# Patient Record
Sex: Male | Born: 1995 | Race: White | Hispanic: No | Marital: Single | Smoking: Never smoker
Health system: Southern US, Community
[De-identification: ages and names within clinical notes are randomized; demographics above are authoritative.]

## PROBLEM LIST (undated history)

## (undated) HISTORY — PX: HERNIA REPAIR: SHX51

---

## 2015-02-05 ENCOUNTER — Encounter (HOSPITAL_COMMUNITY): Payer: Self-pay | Admitting: Cardiology

## 2015-02-05 ENCOUNTER — Emergency Department (HOSPITAL_COMMUNITY): Payer: PRIVATE HEALTH INSURANCE

## 2015-02-05 ENCOUNTER — Emergency Department (HOSPITAL_COMMUNITY)
Admission: EM | Admit: 2015-02-05 | Discharge: 2015-02-05 | Disposition: A | Discharge status: Home / Self Care | Payer: PRIVATE HEALTH INSURANCE | Attending: Emergency Medicine | Admitting: Emergency Medicine

## 2015-02-05 DIAGNOSIS — R1033 Periumbilical pain: Secondary | ICD-10-CM | POA: Diagnosis not present

## 2015-02-05 DIAGNOSIS — R1031 Right lower quadrant pain: Secondary | ICD-10-CM | POA: Diagnosis not present

## 2015-02-05 LAB — BASIC METABOLIC PANEL
Anion gap: 6 (ref 5–15)
BUN: 10 mg/dL (ref 6–20)
CALCIUM: 8.9 mg/dL (ref 8.9–10.3)
CO2: 28 mmol/L (ref 22–32)
CREATININE: 0.75 mg/dL (ref 0.61–1.24)
Chloride: 100 mmol/L — ABNORMAL LOW (ref 101–111)
Glucose, Bld: 101 mg/dL — ABNORMAL HIGH (ref 65–99)
Potassium: 3.7 mmol/L (ref 3.5–5.1)
SODIUM: 134 mmol/L — AB (ref 135–145)

## 2015-02-05 LAB — CBC WITH DIFFERENTIAL/PLATELET
BASOS PCT: 0 % (ref 0–1)
Basophils Absolute: 0 10*3/uL (ref 0.0–0.1)
EOS ABS: 0 10*3/uL (ref 0.0–0.7)
EOS PCT: 0 % (ref 0–5)
HCT: 49.5 % (ref 39.0–52.0)
Hemoglobin: 17.5 g/dL — ABNORMAL HIGH (ref 13.0–17.0)
LYMPHS ABS: 1 10*3/uL (ref 0.7–4.0)
Lymphocytes Relative: 8 % — ABNORMAL LOW (ref 12–46)
MCH: 31.6 pg (ref 26.0–34.0)
MCHC: 35.4 g/dL (ref 30.0–36.0)
MCV: 89.4 fL (ref 78.0–100.0)
MONOS PCT: 9 % (ref 3–12)
Monocytes Absolute: 1.2 10*3/uL — ABNORMAL HIGH (ref 0.1–1.0)
Neutro Abs: 10.8 10*3/uL — ABNORMAL HIGH (ref 1.7–7.7)
Neutrophils Relative %: 83 % — ABNORMAL HIGH (ref 43–77)
PLATELETS: 245 10*3/uL (ref 150–400)
RBC: 5.54 MIL/uL (ref 4.22–5.81)
RDW: 12.6 % (ref 11.5–15.5)
WBC: 13 10*3/uL — AB (ref 4.0–10.5)

## 2015-02-05 MED ORDER — IOHEXOL 300 MG/ML  SOLN
25.0000 mL | Freq: Once | INTRAMUSCULAR | Status: AC | PRN
  Administered 2015-02-05: 25 mL via ORAL

## 2015-02-05 MED ORDER — IOHEXOL 300 MG/ML  SOLN
100.0000 mL | Freq: Once | INTRAMUSCULAR | Status: AC | PRN
  Administered 2015-02-05: 100 mL via INTRAVENOUS

## 2015-02-05 MED ORDER — SODIUM CHLORIDE 0.9 % IV BOLUS (SEPSIS)
1000.0000 mL | Freq: Once | INTRAVENOUS | Status: AC
  Administered 2015-02-05: 1000 mL via INTRAVENOUS

## 2015-02-05 MED ORDER — TRAMADOL HCL 50 MG PO TABS: 50.0000 mg | ORAL_TABLET | Freq: Four times a day (QID) | ORAL | Status: AC | PRN

## 2015-02-05 MED ORDER — DICYCLOMINE HCL 20 MG PO TABS: 20.0000 mg | ORAL_TABLET | Freq: Two times a day (BID) | ORAL | Status: AC

## 2015-02-05 NOTE — ED Provider Notes (Signed)
CSN: [id number]     Arrival date & time 02/05/15  1255 History   First MD Initiated Contact with Patient 02/05/15 1301     Chief Complaint  Patient presents with  . Abdominal Pain     (Consider location/radiation/quality/duration/timing/severity/associated sxs/prior Treatment) HPI Comments: Patient presents to the ER for evaluation of pain in his abdomen. Patient reports the symptoms began last night around 10 PM. He reports that the pain has been continuous since it began, has worsened. He was up most of the night because of pain. Movement makes it worse. He has not found any alleviating factors. He denies fever, nausea, vomiting, diarrhea and constipation. He has not had any urinary symptoms.  Patient is a 19 y.o. male presenting with abdominal pain.  Abdominal Pain   History reviewed. No pertinent past medical history. Past Surgical History  Procedure Laterality Date  . Hernia repair     History reviewed. No pertinent family history. Social History  Substance Use Topics  . Smoking status: Never Smoker   . Smokeless tobacco: None  . Alcohol Use: No    Review of Systems  Gastrointestinal: Positive for abdominal pain.  All other systems reviewed and are negative.     Allergies  Review of patient's allergies indicates no known allergies.  Home Medications   Prior to Admission medications   Not on File   BP 131/72 mmHg  Pulse 98  Temp(Src) 98.4 F (36.9 C) (Oral)  Resp 16  Ht 5\' 6"  (1.676 m)  Wt 189 lb (85.73 kg)  BMI 30.52 kg/m2  SpO2 100% Physical Exam  Constitutional: He is oriented to person, place, and time. He appears well-developed and well-nourished. No distress.  HENT:  Head: Normocephalic and atraumatic.  Right Ear: Hearing normal.  Left Ear: Hearing normal.  Nose: Nose normal.  Mouth/Throat: Oropharynx is clear and moist and mucous membranes are normal.  Eyes: Conjunctivae and EOM are normal. Pupils are equal, round, and reactive to light.  Neck:  Normal range of motion. Neck supple.  Cardiovascular: Regular rhythm, S1 normal and S2 normal.  Exam reveals no gallop and no friction rub.   No murmur heard. Pulmonary/Chest: Effort normal and breath sounds normal. No respiratory distress. He exhibits no tenderness.  Abdominal: Soft. Normal appearance and bowel sounds are normal. There is no hepatosplenomegaly. There is tenderness in the right lower quadrant and periumbilical area. There is no rebound, no guarding, no tenderness at McBurney's point and negative Murphy's sign. No hernia.  Musculoskeletal: Normal range of motion.  Neurological: He is alert and oriented to person, place, and time. He has normal strength. No cranial nerve deficit or sensory deficit. Coordination normal. GCS eye subscore is 4. GCS verbal subscore is 5. GCS motor subscore is 6.  Skin: Skin is warm, dry and intact. No rash noted. No cyanosis.  Psychiatric: He has a normal mood and affect. His speech is normal and behavior is normal. Thought content normal.  Nursing note and vitals reviewed.   ED Course  Procedures (including critical care time) Labs Review Labs Reviewed  CBC WITH DIFFERENTIAL/PLATELET  BASIC METABOLIC PANEL    Imaging Review Ct Abdomen Pelvis W Contrast  02/05/2015   CLINICAL DATA:  Periumbilical and right lower quadrant pain  EXAM: CT ABDOMEN AND PELVIS WITH CONTRAST  TECHNIQUE: Multidetector CT imaging of the abdomen and pelvis was performed using the standard protocol following bolus administration of intravenous contrast.  CONTRAST:  25mL OMNIPAQUE IOHEXOL 300 MG/ML SOLN, OMNIPAQUE IOHEXOL 300 MG/ML  SOLN  COMPARISON:  None.  FINDINGS: BODY WALL: No contributory findings.  LOWER CHEST: Calcified granuloma in the lingula.  ABDOMEN/PELVIS:  Liver: No focal abnormality.  Biliary: No evidence of biliary obstruction or stone.  Pancreas: Unremarkable.  Spleen: Unremarkable.  Adrenals: Unremarkable.  Kidneys and ureters: Horseshoe kidney with  punctate left nephrolithiasis. No hydronephrosis.  Bladder: Unremarkable.  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  Bowel: No suspected appendicitis. The appendix has a normal outer wall diameter of 6 mm and has isoenhancing mucosal relative to the small bowel. No focal periappendiceal inflammation. No bowel obstruction.  Retroperitoneum: No mass or adenopathy.  Peritoneum: No ascites or pneumoperitoneum.  Vascular: No acute abnormality.  OSSEOUS: No acute abnormalities.  IMPRESSION: 1. No acute finding. 2. Horseshoe kidney with punctate left calculus. 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   Electronically Signed   By: Marnee Spring M.D.   On: 02/05/2015 13:47   I have personally reviewed and evaluated these images and lab results as part of my medical decision-making.   EKG Interpretation None      MDM   Final diagnoses:  None   abdominal pain  Presents to the ER for evaluation of abdominal pain. Symptoms began last night. Pain is been predominantly periumbilical, but he did have some tenderness in the right lower quadrant on examination as well. There was no guarding or rebound, however. CT was performed to further evaluate. No acute abnormality is seen. Patient does not have any evidence of appendicitis or other acute abnormality. Will treat symptomatically.   Gilda Crease, MD 02/05/15 [phone]

## 2015-02-05 NOTE — Discharge Instructions (Signed)

## 2015-02-05 NOTE — ED Notes (Signed)
Abdominal pain since last night.

## 2016-08-20 IMAGING — CT CT ABD-PELV W/ CM
2 of 4 series · 16 of 46 positions shown, 18 images · IV contrast (Omnipaque 300)
Comparison: None.

CLINICAL DATA: Periumbilical and right lower quadrant pain

EXAM:
CT ABDOMEN AND PELVIS WITH CONTRAST
TECHNIQUE: Multidetector CT imaging of the abdomen and pelvis was performed
using the standard protocol following bolus administration of
intravenous contrast.
CONTRAST:  25mL OMNIPAQUE IOHEXOL 300 MG/ML SOLN, 100mL OMNIPAQUE
IOHEXOL 300 MG/ML SOLN

[Series 2: abd_pel_with 5.0 b40f · axial · 0.73mm/px · z∈[-452,+8]mm · 13 of 102 slices shown, 15 images]
[im 5/102  soft-tissue]
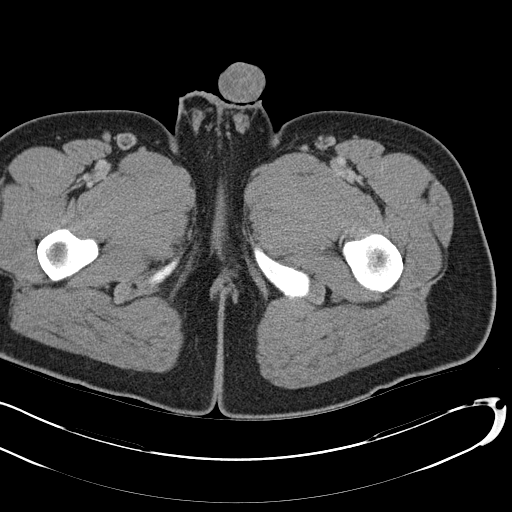
[im 5/102  bone]
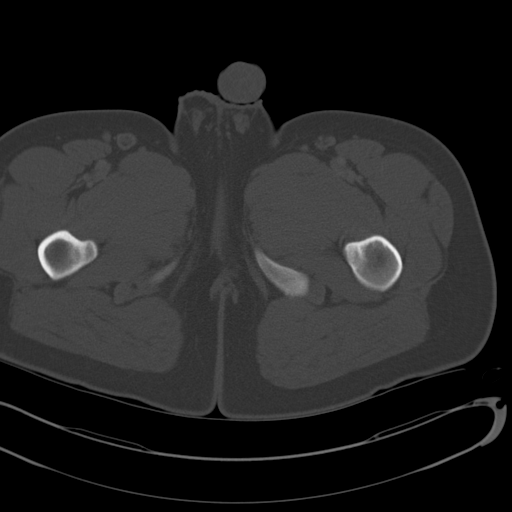
[im 14/102  soft-tissue]
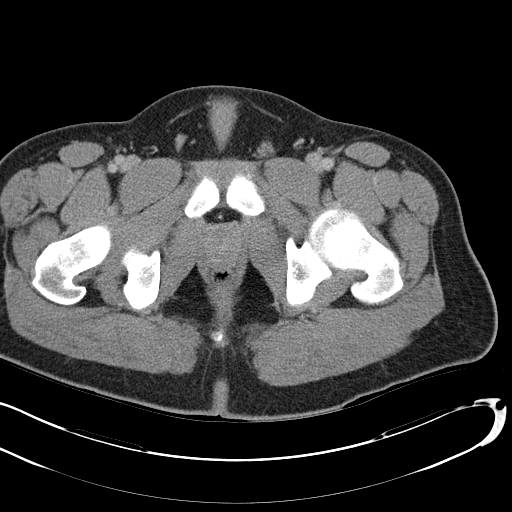
[im 22/102  soft-tissue]
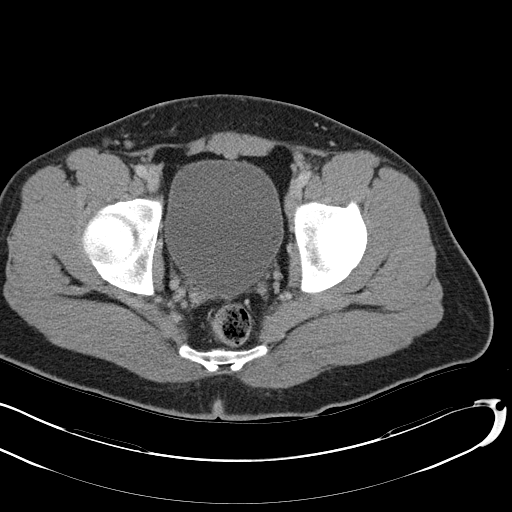
[im 27/102  soft-tissue]
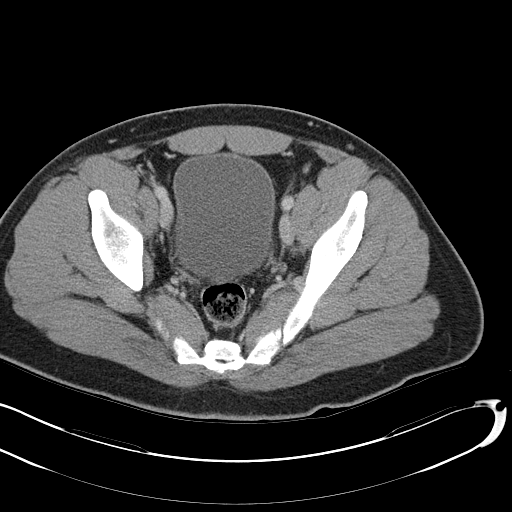
[im 36/102  soft-tissue]
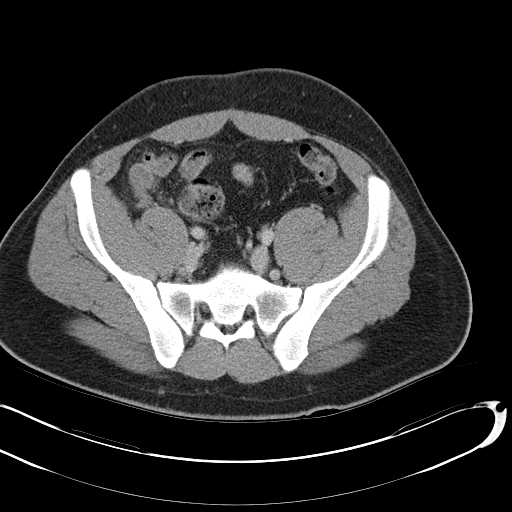
[im 44/102  soft-tissue]
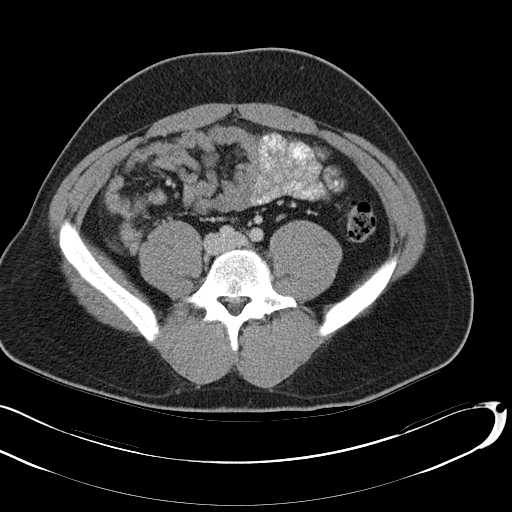
[im 53/102  soft-tissue]
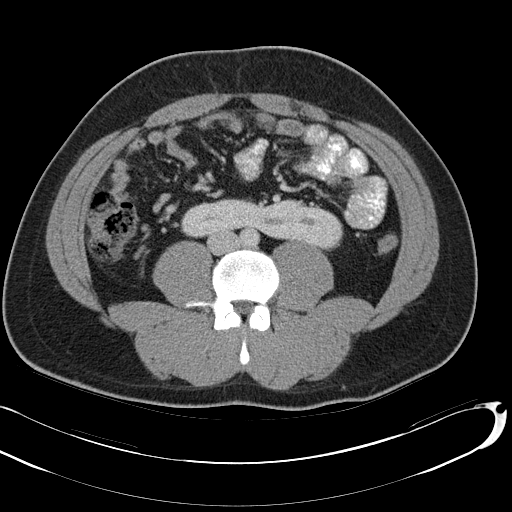
[im 58/102  soft-tissue]
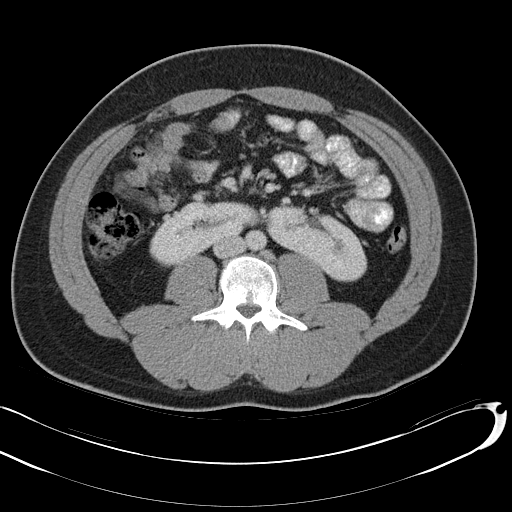
[im 66/102  soft-tissue]
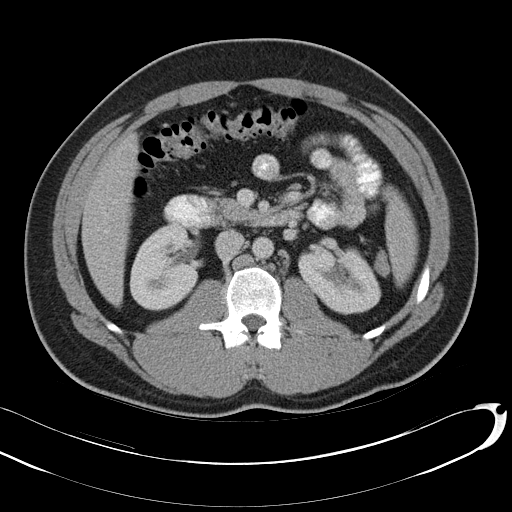
[im 66/102  bone]
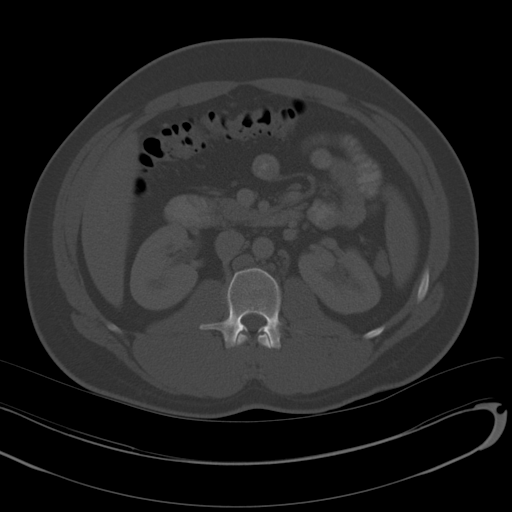
[im 75/102  soft-tissue]
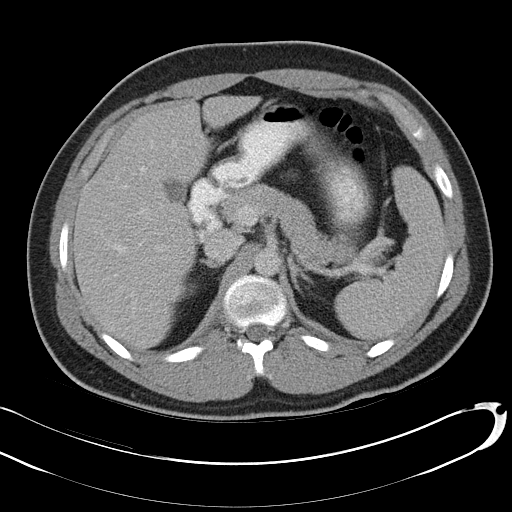
[im 80/102  soft-tissue]
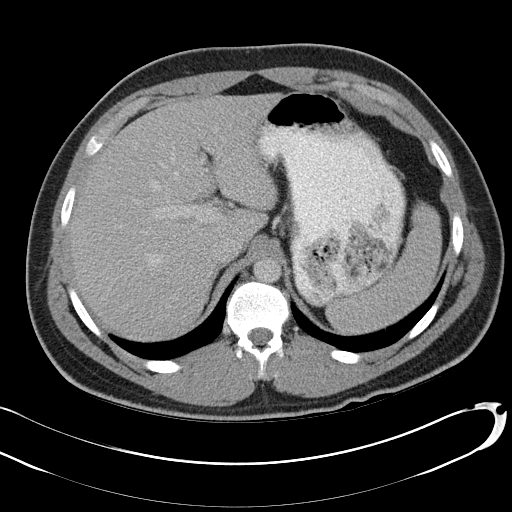
[im 88/102  soft-tissue]
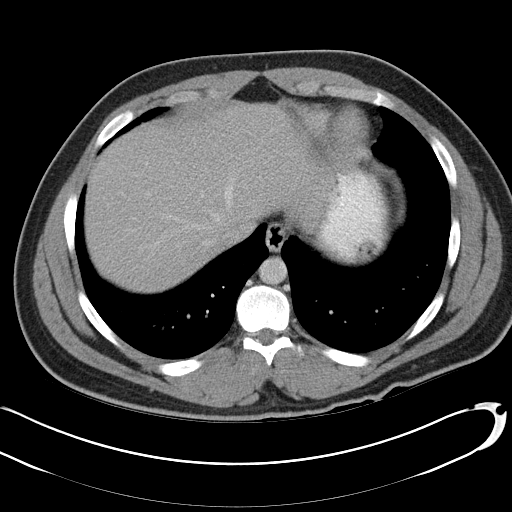
[im 97/102  soft-tissue]
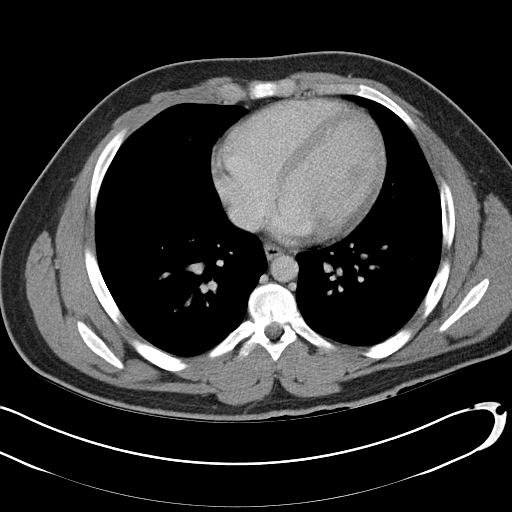

[Series 4: abd_pel_with 3.0 spo · coronal · 0.76mm/px · 3 of 92 slices shown]
[im 31/92  soft-tissue]
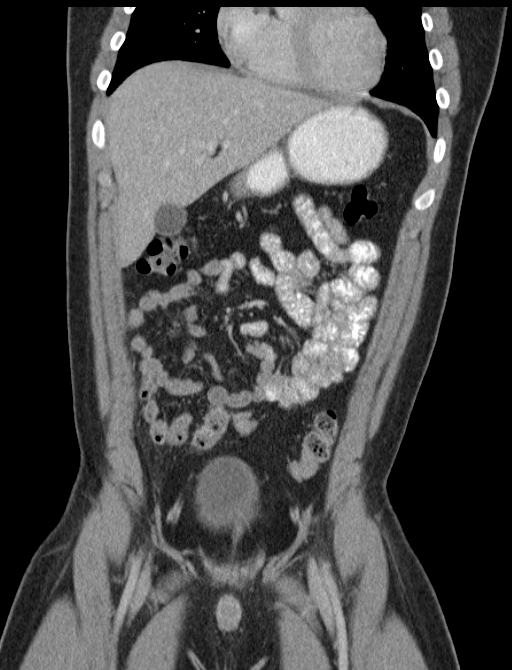
[im 41/92  soft-tissue]
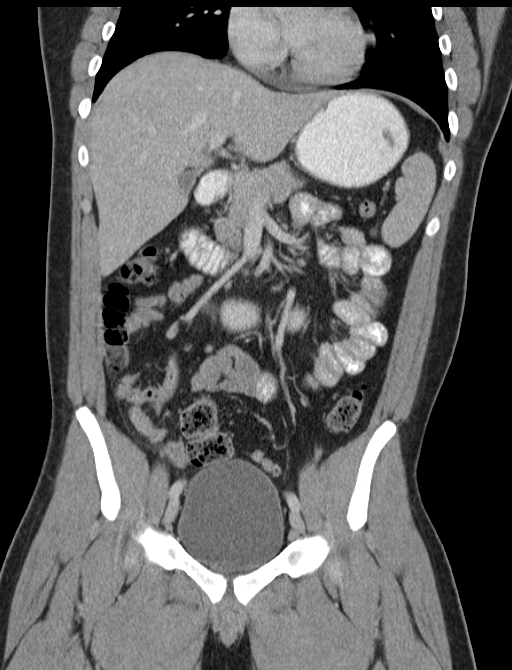
[im 51/92  soft-tissue]
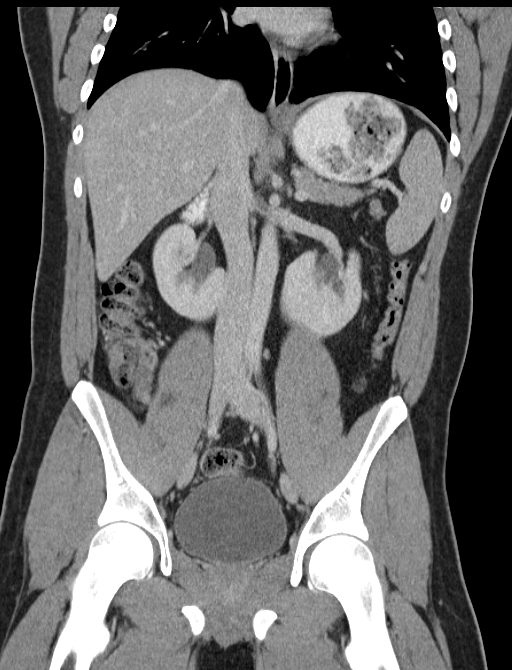

[16 of 46 positions shown; findings below may reference images not displayed]

FINDINGS: BODY WALL: No contributory findings.

LOWER CHEST: Calcified granuloma in the lingula.

ABDOMEN/PELVIS:

Liver: No focal abnormality.

Biliary: No evidence of biliary obstruction or stone.

Pancreas: Unremarkable.

Spleen: Unremarkable.

Adrenals: Unremarkable.

Kidneys and ureters: Horseshoe kidney with punctate left
nephrolithiasis. No hydronephrosis.

Bladder: Unremarkable.

Reproductive: 6 mm cyst in the midline posterior prostate region,
likely congenital given patient age and renal findings.

Bowel: No suspected appendicitis. The appendix has a normal outer
wall diameter of 6 mm and has isoenhancing mucosal relative to the
small bowel. No focal periappendiceal inflammation. No bowel
obstruction.

Retroperitoneum: No mass or adenopathy.

Peritoneum: No ascites or pneumoperitoneum.

Vascular: No acute abnormality.

OSSEOUS: No acute abnormalities.
IMPRESSION: 1. No acute finding.
2. Horseshoe kidney with punctate left calculus.
3. Prostate utricle or mullerian duct cyst.
